# Patient Record
Sex: Male | Born: 1990 | Race: White | Hispanic: No | Marital: Single | Smoking: Never smoker
Health system: Southern US, Community
[De-identification: ages and names within clinical notes are randomized; demographics above are authoritative.]

## PROBLEM LIST (undated history)

## (undated) HISTORY — PX: NASAL SINUS SURGERY: SHX719

## (undated) HISTORY — PX: OTHER SURGICAL HISTORY: SHX169

---

## 2016-06-21 DIAGNOSIS — L509 Urticaria, unspecified: Secondary | ICD-10-CM | POA: Diagnosis not present

## 2017-03-17 DIAGNOSIS — S60221A Contusion of right hand, initial encounter: Secondary | ICD-10-CM | POA: Diagnosis not present

## 2017-10-22 ENCOUNTER — Other Ambulatory Visit: Payer: Self-pay | Admitting: Gastroenterology

## 2017-10-22 DIAGNOSIS — Z789 Other specified health status: Secondary | ICD-10-CM | POA: Diagnosis not present

## 2017-10-22 DIAGNOSIS — R1011 Right upper quadrant pain: Secondary | ICD-10-CM

## 2017-10-22 DIAGNOSIS — R195 Other fecal abnormalities: Secondary | ICD-10-CM | POA: Diagnosis not present

## 2017-10-29 ENCOUNTER — Other Ambulatory Visit: Payer: Self-pay

## 2017-11-07 ENCOUNTER — Ambulatory Visit
Admission: RE | Admit: 2017-11-07 | Discharge: 2017-11-07 | Disposition: A | Discharge status: Home / Self Care | Payer: BLUE CROSS/BLUE SHIELD | Source: Ambulatory Visit | Attending: Gastroenterology | Admitting: Gastroenterology

## 2017-11-07 DIAGNOSIS — R1011 Right upper quadrant pain: Secondary | ICD-10-CM | POA: Diagnosis not present

## 2018-01-02 DIAGNOSIS — R1011 Right upper quadrant pain: Secondary | ICD-10-CM | POA: Diagnosis not present

## 2018-01-02 DIAGNOSIS — Z789 Other specified health status: Secondary | ICD-10-CM | POA: Diagnosis not present

## 2018-01-02 DIAGNOSIS — R195 Other fecal abnormalities: Secondary | ICD-10-CM | POA: Diagnosis not present

## 2018-05-13 DIAGNOSIS — Z8249 Family history of ischemic heart disease and other diseases of the circulatory system: Secondary | ICD-10-CM | POA: Diagnosis not present

## 2018-05-13 DIAGNOSIS — Z13220 Encounter for screening for lipoid disorders: Secondary | ICD-10-CM | POA: Diagnosis not present

## 2018-05-13 DIAGNOSIS — Z131 Encounter for screening for diabetes mellitus: Secondary | ICD-10-CM | POA: Diagnosis not present

## 2018-05-13 DIAGNOSIS — Z0000 Encounter for general adult medical examination without abnormal findings: Secondary | ICD-10-CM | POA: Diagnosis not present

## 2018-05-13 DIAGNOSIS — Z23 Encounter for immunization: Secondary | ICD-10-CM | POA: Diagnosis not present

## 2019-08-15 ENCOUNTER — Emergency Department (HOSPITAL_COMMUNITY): Payer: BC Managed Care – PPO

## 2019-08-15 ENCOUNTER — Emergency Department (HOSPITAL_COMMUNITY)
Admission: EM | Admit: 2019-08-15 | Discharge: 2019-08-16 | Disposition: A | Discharge status: Home / Self Care | Payer: BC Managed Care – PPO | Attending: Emergency Medicine | Admitting: Emergency Medicine

## 2019-08-15 ENCOUNTER — Other Ambulatory Visit: Payer: Self-pay

## 2019-08-15 ENCOUNTER — Encounter (HOSPITAL_COMMUNITY): Payer: Self-pay | Admitting: Emergency Medicine

## 2019-08-15 DIAGNOSIS — R0602 Shortness of breath: Secondary | ICD-10-CM | POA: Diagnosis not present

## 2019-08-15 DIAGNOSIS — R002 Palpitations: Secondary | ICD-10-CM | POA: Diagnosis not present

## 2019-08-15 DIAGNOSIS — F17220 Nicotine dependence, chewing tobacco, uncomplicated: Secondary | ICD-10-CM | POA: Insufficient documentation

## 2019-08-15 LAB — CBC WITH DIFFERENTIAL/PLATELET
Abs Immature Granulocytes: 0.03 10*3/uL (ref 0.00–0.07)
Basophils Absolute: 0 10*3/uL (ref 0.0–0.1)
Basophils Relative: 1 %
Eosinophils Absolute: 0.3 10*3/uL (ref 0.0–0.5)
Eosinophils Relative: 4 %
HCT: 43.6 % (ref 39.0–52.0)
Hemoglobin: 15 g/dL (ref 13.0–17.0)
Immature Granulocytes: 0 %
Lymphocytes Relative: 50 %
Lymphs Abs: 3.9 10*3/uL (ref 0.7–4.0)
MCH: 30.2 pg (ref 26.0–34.0)
MCHC: 34.4 g/dL (ref 30.0–36.0)
MCV: 87.7 fL (ref 80.0–100.0)
Monocytes Absolute: 0.7 10*3/uL (ref 0.1–1.0)
Monocytes Relative: 9 %
Neutro Abs: 2.8 10*3/uL (ref 1.7–7.7)
Neutrophils Relative %: 36 %
Platelets: 262 10*3/uL (ref 150–400)
RBC: 4.97 MIL/uL (ref 4.22–5.81)
RDW: 11.9 % (ref 11.5–15.5)
WBC: 7.7 10*3/uL (ref 4.0–10.5)
nRBC: 0 % (ref 0.0–0.2)

## 2019-08-15 LAB — BASIC METABOLIC PANEL
Anion gap: 12 (ref 5–15)
BUN: 12 mg/dL (ref 6–20)
CO2: 20 mmol/L — ABNORMAL LOW (ref 22–32)
Calcium: 9.1 mg/dL (ref 8.9–10.3)
Chloride: 106 mmol/L (ref 98–111)
Creatinine, Ser: 0.94 mg/dL (ref 0.61–1.24)
GFR calc Af Amer: >60 mL/min (ref >60)
GFR calc non Af Amer: >60 mL/min (ref >60)
Glucose, Bld: 130 mg/dL — ABNORMAL HIGH (ref 70–99)
Potassium: 3.5 mmol/L (ref 3.5–5.1)
Sodium: 138 mmol/L (ref 135–145)

## 2019-08-15 LAB — TROPONIN I (HIGH SENSITIVITY): Troponin I (High Sensitivity): <2 ng/L (ref <18)

## 2019-08-15 MED ORDER — SODIUM CHLORIDE 0.9 % IV BOLUS
1000.0000 mL | Freq: Once | INTRAVENOUS | Status: AC
  Administered 2019-08-15: 1000 mL via INTRAVENOUS

## 2019-08-15 NOTE — ED Triage Notes (Signed)
Pt c/o SOB, rapid heart beat, elevated BP, and felt hot then cold x 1 hour.

## 2019-08-16 LAB — TROPONIN I (HIGH SENSITIVITY): Troponin I (High Sensitivity): 2 ng/L (ref <18)

## 2019-08-16 NOTE — ED Notes (Signed)
Pt ambulatory to waiting room. Pt verbalized understanding of discharge instructions.   

## 2019-08-16 NOTE — ED Provider Notes (Signed)
[facility] EMERGENCY DEPARTMENT Provider Note   CSN: [id number] Arrival date & time: 08/15/19  2159     History   Chief Complaint Chief Complaint  Patient presents with  . Shortness of Breath    HPI Brian Shelton. is a 28 y.o. male with no PMH who presents to the ED with acute onset palpitations, shortness of breath, nausea, lightheadedness, and feeling flushed.  He reports that he was sitting at home on the couch relaxing with his wife when the symptoms began.  He admits that he consumes approximately 16 beers yesterday while participating in a [employer].  He also consumed a couple vodkas this afternoon for "hair of the dog" to nurse his hangover.  Patient states that he had consumed Gatorade and water prior to drinking vodka today.  He reports that he is never felt these symptoms before and that he denies any history of anxiety or significant stress.  He denies any chest pain associated with his symptoms.  Currently on exam, he feels as though he still is mildly short of breath, but feels mostly improved.  Denies any recent illness, fevers or chills, vomiting, chest pain, abdominal pain, or other symptoms.  He also denies any history of clots, clotting disorder, recent immobilization or surgery, recent leg swelling, or hormone replacement.     HPI  History reviewed. No pertinent past medical history.  There are no active problems to display for this patient.   Past Surgical History:  Procedure Laterality Date  . 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 NASAL SINUS SURGERY          Home Medications    Prior to Admission medications   Not on File    Family History No family history on file.  Social History Social History   Tobacco Use  . Smoking status: Never Smoker  . Smokeless tobacco: Current User    Types: Chew  Substance Use Topics  . Alcohol use: Yes    Alcohol/week: 4.0 standard drinks    Types: 4 Shots of liquor per week    Comment: occ  . Drug use:  Not Currently     Allergies   Bee venom   Review of Systems Review of Systems  All other systems reviewed and are negative.    Physical Exam Updated Vital Signs BP 129/72   Pulse 90   Temp 98 F (36.7 C)   Resp 13   Ht 5\' 11"  (1.803 m)   Wt 97.5 kg   SpO2 96%   BMI 29.99 kg/m   Physical Exam BP 129/72   Pulse 90   Temp 98 F (36.7 C)   Resp 13   Ht 5\' 11"  (1.803 m)   Wt 97.5 kg   SpO2 96%   BMI 29.99 kg/m   Physical Exam Vitals signs and nursing note reviewed. Exam conducted with a chaperone present.  Constitutional:      Appearance: Normal appearance.  HENT:     Head: Normocephalic and atraumatic.     Nose: Nose normal.  Eyes:     General: No scleral icterus.    Extraocular Movements: Extraocular movements intact.     Conjunctiva/sclera: Conjunctivae normal.  Neck:     Musculoskeletal: Normal range of motion and neck supple.  Cardiovascular:     Rate and Rhythm: Normal rate and regular rhythm.     Pulses: Normal pulses.     Heart sounds: Normal heart sounds.  Pulmonary:  Effort: Pulmonary effort is normal. No respiratory distress.     Breath sounds: Normal breath sounds. No wheezing or rales.  Abdominal:     General: Abdomen is flat. There is no distension.     Palpations: Abdomen is soft.     Tenderness: There is no abdominal tenderness.  Skin:    General: Skin is dry.  Neurological:     Mental Status: He is alert and oriented to person, place, and time.     GCS: GCS eye subscore is 4. GCS verbal subscore is 5. GCS motor subscore is 6.  Psychiatric:        Mood and Affect: Mood normal.        Behavior: Behavior normal.        Thought Content: Thought content normal.   ED Treatments / Results  Labs (all labs ordered are listed, but only abnormal results are displayed) Labs Reviewed  BASIC METABOLIC PANEL - Abnormal; Notable for the following components:      Result Value   CO2 20 (*)    Glucose, Bld 130 (*)    All other components  within normal limits  CBC WITH DIFFERENTIAL/PLATELET  TROPONIN I (HIGH SENSITIVITY)  TROPONIN I (HIGH SENSITIVITY)    EKG EKG Interpretation  Date/Time:  Sunday August 15 2019 22:11:22 EST Ventricular Rate:  84 PR Interval:    QRS Duration: 95 QT Interval:  357 QTC Calculation: 422 R Axis:   65 Text Interpretation: Sinus rhythm Confirmed by Gerlene Fee [id number]) on 08/15/2019 10:17:53 PM   Radiology Dg Chest Portable 1 View  Result Date: 08/15/2019 CLINICAL DATA:  Shortness of breath, felt hot and cold for 1 hour EXAM: PORTABLE CHEST 1 VIEW COMPARISON:  None. FINDINGS: No consolidation, features of edema, pneumothorax, or effusion. Pulmonary vascularity is normally distributed. The cardiomediastinal contours are unremarkable. No acute osseous or soft tissue abnormality. IMPRESSION: No acute cardiopulmonary abnormality. Electronically Signed   By: Lovena Le M.D.   On: 08/15/2019 23:00    Procedures Procedures (including critical care time)  Medications Ordered in ED Medications  sodium chloride 0.9 % bolus 1,000 mL (0 mLs Intravenous Stopped 08/16/19 0053)     Initial Impression / Assessment and Plan / ED Course  I have reviewed the triage vital signs and the nursing notes.  Pertinent labs & imaging results that were available during my care of the patient were reviewed by me and considered in my medical decision making (see chart for details).        EKG was obtained and is normal sinus rhythm but no prior tracings with which to compare and evidence of Q waves.  Will obtain trend troponin given tachycardia and shortness of breath is a chest pain equivalent.   BMP demonstrates no electrolyte abnormalities that might explain his symptoms.  His CBC demonstrated no anemia or leukocytosis concerning for infection.  DG chest was interpreted and demonstrates no acute cardiopulmonary disease.  His initial troponin was less than two, but given acute nature of his shortness  of breath and tachycardia, will trend once prior to discharge.  On reexamination, patient is asleep and resting comfortably.  All of his lab work is reassuring and once we have his second troponin, he will be safe for discharge.  His history and physical exam is consistent with an episode of presyncope, likely attributable to his day long alcohol binge yesterday with continued "hair of the dog" activity today with an adequate oral hydration.  Provide him with 1 L  of normal saline here in the ED for his BP that was 99/65 on my physical examination, contrary to his arrival vitals.   At shift change care was transferred to Dr. Blinda LeatherwoodPollina who will follow pending studies, re-evaluate, and determine disposition.     Final Clinical Impressions(s) / ED Diagnoses   Final diagnoses:  SOB (shortness of breath)    ED Discharge Orders    None       Lorelee NewGreen, Jaena Brocato L, PA-C 08/16/19 0118    Sabas SousBero, Michael M, MD 08/16/19 [phone]

## 2019-09-29 DIAGNOSIS — Z03818 Encounter for observation for suspected exposure to other biological agents ruled out: Secondary | ICD-10-CM | POA: Diagnosis not present

## 2019-09-29 DIAGNOSIS — Z20828 Contact with and (suspected) exposure to other viral communicable diseases: Secondary | ICD-10-CM | POA: Diagnosis not present

## 2019-09-29 DIAGNOSIS — U071 COVID-19: Secondary | ICD-10-CM | POA: Diagnosis not present

## 2019-12-08 DIAGNOSIS — S86912A Strain of unspecified muscle(s) and tendon(s) at lower leg level, left leg, initial encounter: Secondary | ICD-10-CM | POA: Diagnosis not present

## 2019-12-08 DIAGNOSIS — X58XXXA Exposure to other specified factors, initial encounter: Secondary | ICD-10-CM | POA: Diagnosis not present

## 2019-12-08 DIAGNOSIS — Y9339 Activity, other involving climbing, rappelling and jumping off: Secondary | ICD-10-CM | POA: Diagnosis not present

## 2021-02-09 IMAGING — DX DG CHEST 1V PORT
1 series · 1 of 1 positions shown · non-contrast
Comparison: None.

CLINICAL DATA: Shortness of breath, felt hot and cold for 1 hour

EXAM:
PORTABLE CHEST 1 VIEW

[chest ap]
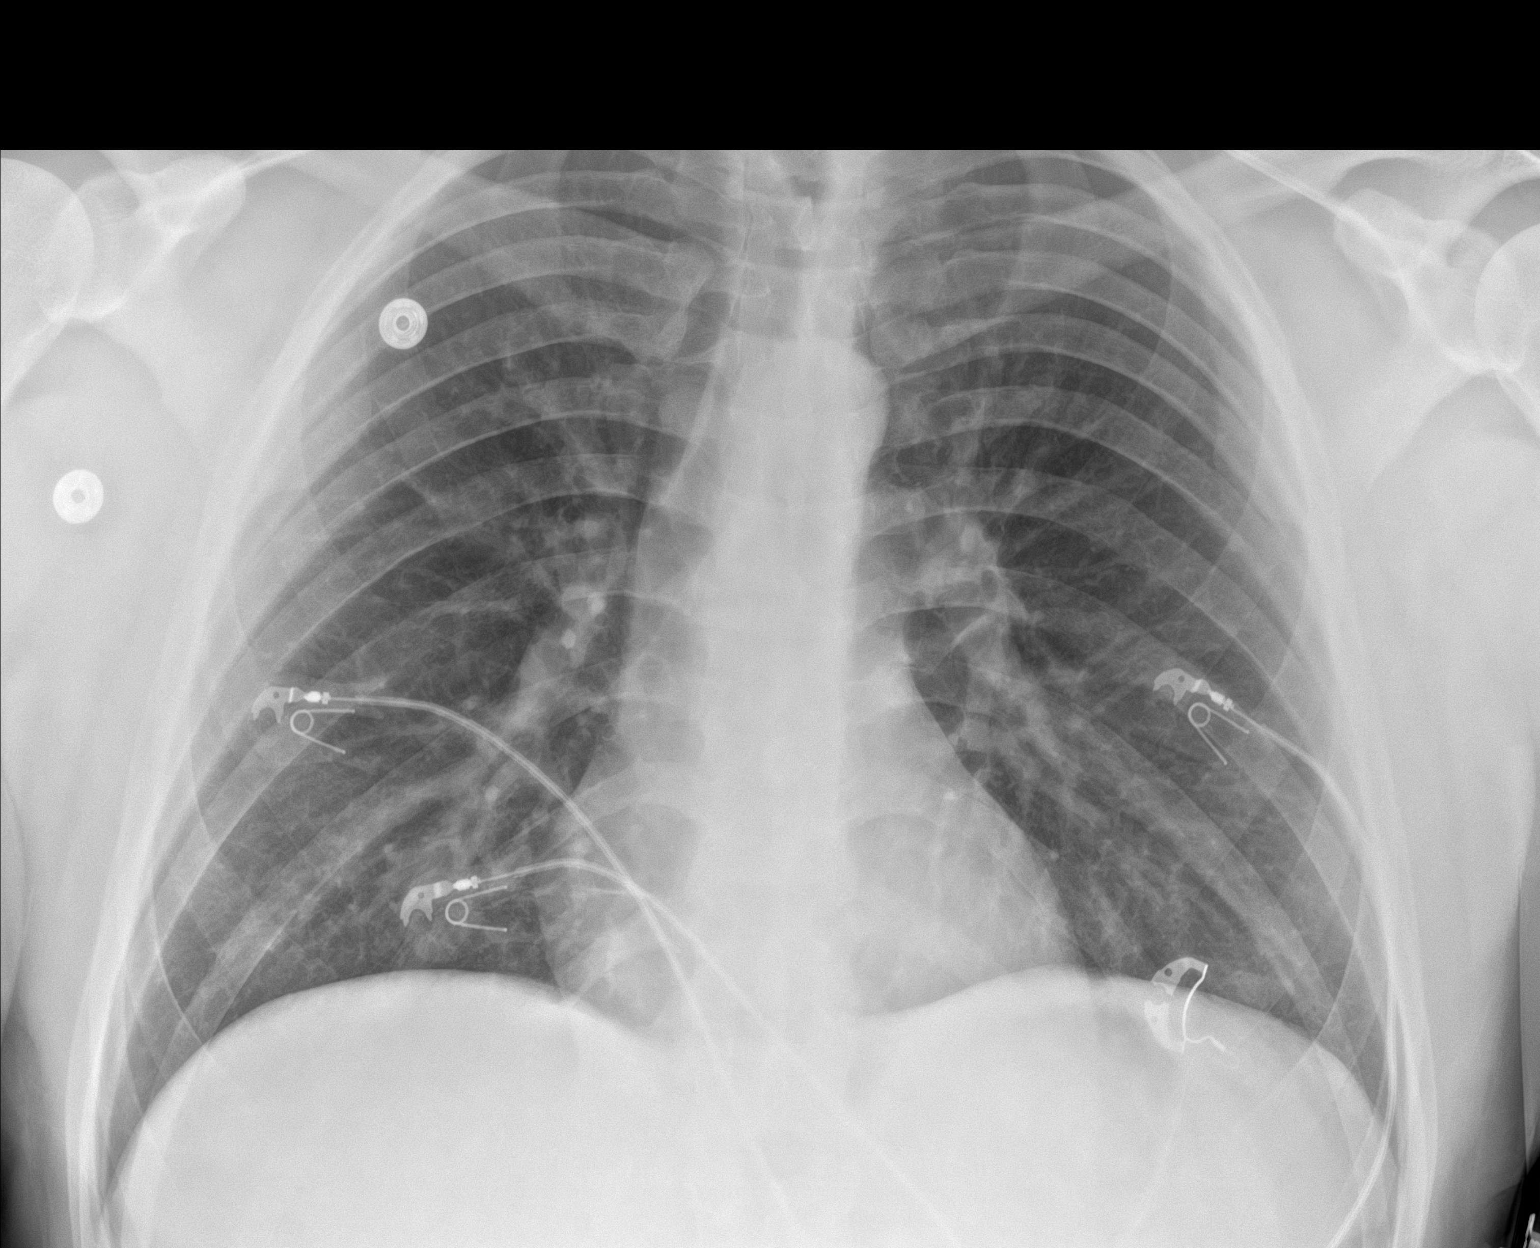

[1 of 1 positions shown; findings below may reference images not displayed]

FINDINGS: No consolidation, features of edema, pneumothorax, or effusion.
Pulmonary vascularity is normally distributed. The cardiomediastinal
contours are unremarkable. No acute osseous or soft tissue
abnormality.
IMPRESSION: No acute cardiopulmonary abnormality.
